# Patient Record
Sex: Female | Born: 1975 | Hispanic: Yes | Marital: Married | Smoking: Never smoker
Health system: Southern US, Community
[De-identification: ages and names within clinical notes are randomized; demographics above are authoritative.]

## PROBLEM LIST (undated history)

## (undated) DIAGNOSIS — E119 Type 2 diabetes mellitus without complications: Secondary | ICD-10-CM

## (undated) HISTORY — PX: CHOLECYSTECTOMY: SHX55

---

## 2005-09-15 ENCOUNTER — Ambulatory Visit: Payer: Self-pay

## 2010-08-11 ENCOUNTER — Ambulatory Visit: Payer: Self-pay | Admitting: Obstetrics and Gynecology

## 2010-10-20 ENCOUNTER — Ambulatory Visit: Payer: Self-pay | Admitting: Obstetrics and Gynecology

## 2016-09-15 ENCOUNTER — Other Ambulatory Visit
Admission: RE | Admit: 2016-09-15 | Discharge: 2016-09-15 | Disposition: A | Discharge status: Home / Self Care | Payer: BLUE CROSS/BLUE SHIELD | Source: Ambulatory Visit | Attending: Obstetrics and Gynecology | Admitting: Obstetrics and Gynecology

## 2016-09-15 ENCOUNTER — Other Ambulatory Visit: Payer: Self-pay | Admitting: Obstetrics and Gynecology

## 2016-09-15 DIAGNOSIS — Z01419 Encounter for gynecological examination (general) (routine) without abnormal findings: Secondary | ICD-10-CM | POA: Insufficient documentation

## 2016-09-15 DIAGNOSIS — Z1231 Encounter for screening mammogram for malignant neoplasm of breast: Secondary | ICD-10-CM

## 2016-09-15 LAB — CBC WITH DIFFERENTIAL/PLATELET
BASOS PCT: 0 %
Basophils Absolute: 0 10*3/uL (ref 0–0.1)
EOS ABS: 0.2 10*3/uL (ref 0–0.7)
EOS PCT: 3 %
HCT: 39.1 % (ref 35.0–47.0)
HEMOGLOBIN: 12.9 g/dL (ref 12.0–16.0)
Lymphocytes Relative: 25 %
Lymphs Abs: 2.4 10*3/uL (ref 1.0–3.6)
MCH: 24.9 pg — ABNORMAL LOW (ref 26.0–34.0)
MCHC: 33 g/dL (ref 32.0–36.0)
MCV: 75.5 fL — ABNORMAL LOW (ref 80.0–100.0)
Monocytes Absolute: 0.6 10*3/uL (ref 0.2–0.9)
Monocytes Relative: 6 %
NEUTROS PCT: 66 %
Neutro Abs: 6.3 10*3/uL (ref 1.4–6.5)
PLATELETS: 243 10*3/uL (ref 150–440)
RBC: 5.18 MIL/uL (ref 3.80–5.20)
RDW: 16.2 % — ABNORMAL HIGH (ref 11.5–14.5)
WBC: 9.5 10*3/uL (ref 3.6–11.0)

## 2016-09-15 LAB — COMPREHENSIVE METABOLIC PANEL
ALBUMIN: 4.3 g/dL (ref 3.5–5.0)
ALK PHOS: 63 U/L (ref 38–126)
ALT: 19 U/L (ref 14–54)
ANION GAP: 7 (ref 5–15)
AST: 18 U/L (ref 15–41)
BUN: 16 mg/dL (ref 6–20)
CALCIUM: 9.2 mg/dL (ref 8.9–10.3)
CHLORIDE: 106 mmol/L (ref 101–111)
CO2: 25 mmol/L (ref 22–32)
CREATININE: 0.68 mg/dL (ref 0.44–1.00)
GFR calc Af Amer: >60 mL/min (ref >60)
GFR calc non Af Amer: >60 mL/min (ref >60)
GLUCOSE: 99 mg/dL (ref 65–99)
Potassium: 4.1 mmol/L (ref 3.5–5.1)
SODIUM: 138 mmol/L (ref 135–145)
Total Bilirubin: 0.5 mg/dL (ref 0.3–1.2)
Total Protein: 7.4 g/dL (ref 6.5–8.1)

## 2016-09-15 LAB — LIPID PANEL
CHOLESTEROL: 151 mg/dL (ref 0–200)
HDL: 40 mg/dL — ABNORMAL LOW (ref >40)
LDL CALC: 95 mg/dL (ref 0–99)
Total CHOL/HDL Ratio: 3.8 RATIO
Triglycerides: 81 mg/dL (ref <150)
VLDL: 16 mg/dL (ref 0–40)

## 2016-09-15 LAB — TSH: TSH: 2.119 u[IU]/mL (ref 0.350–4.500)

## 2016-09-16 LAB — HEMOGLOBIN A1C
Hgb A1c MFr Bld: 6.6 % — ABNORMAL HIGH (ref 4.8–5.6)
Mean Plasma Glucose: 143 mg/dL

## 2016-09-26 ENCOUNTER — Encounter: Payer: Self-pay | Admitting: Radiology

## 2016-09-26 ENCOUNTER — Ambulatory Visit
Admission: RE | Admit: 2016-09-26 | Discharge: 2016-09-26 | Disposition: A | Discharge status: Home / Self Care | Payer: BLUE CROSS/BLUE SHIELD | Source: Ambulatory Visit | Attending: Obstetrics and Gynecology | Admitting: Obstetrics and Gynecology

## 2016-09-26 DIAGNOSIS — Z1231 Encounter for screening mammogram for malignant neoplasm of breast: Secondary | ICD-10-CM | POA: Insufficient documentation

## 2021-10-08 ENCOUNTER — Emergency Department (HOSPITAL_BASED_OUTPATIENT_CLINIC_OR_DEPARTMENT_OTHER): Payer: 59

## 2021-10-08 ENCOUNTER — Encounter (HOSPITAL_BASED_OUTPATIENT_CLINIC_OR_DEPARTMENT_OTHER): Payer: Self-pay | Admitting: Emergency Medicine

## 2021-10-08 ENCOUNTER — Emergency Department (HOSPITAL_BASED_OUTPATIENT_CLINIC_OR_DEPARTMENT_OTHER)
Admission: EM | Admit: 2021-10-08 | Discharge: 2021-10-08 | Disposition: A | Discharge status: Home / Self Care | Payer: 59 | Attending: Emergency Medicine | Admitting: Emergency Medicine

## 2021-10-08 ENCOUNTER — Other Ambulatory Visit: Payer: Self-pay

## 2021-10-08 DIAGNOSIS — R55 Syncope and collapse: Secondary | ICD-10-CM | POA: Diagnosis not present

## 2021-10-08 DIAGNOSIS — R519 Headache, unspecified: Secondary | ICD-10-CM

## 2021-10-08 DIAGNOSIS — J0110 Acute frontal sinusitis, unspecified: Secondary | ICD-10-CM | POA: Diagnosis not present

## 2021-10-08 DIAGNOSIS — E119 Type 2 diabetes mellitus without complications: Secondary | ICD-10-CM | POA: Diagnosis not present

## 2021-10-08 HISTORY — DX: Type 2 diabetes mellitus without complications: E11.9

## 2021-10-08 LAB — HCG, SERUM, QUALITATIVE: Preg, Serum: NEGATIVE

## 2021-10-08 LAB — CBC
HCT: 39.3 % (ref 36.0–46.0)
Hemoglobin: 12.3 g/dL (ref 12.0–15.0)
MCH: 23.7 pg — ABNORMAL LOW (ref 26.0–34.0)
MCHC: 31.3 g/dL (ref 30.0–36.0)
MCV: 75.7 fL — ABNORMAL LOW (ref 80.0–100.0)
Platelets: 356 10*3/uL (ref 150–400)
RBC: 5.19 MIL/uL — ABNORMAL HIGH (ref 3.87–5.11)
RDW: 14.9 % (ref 11.5–15.5)
WBC: 8.2 10*3/uL (ref 4.0–10.5)
nRBC: 0 % (ref 0.0–0.2)

## 2021-10-08 LAB — BASIC METABOLIC PANEL
Anion gap: 8 (ref 5–15)
BUN: 12 mg/dL (ref 6–20)
CO2: 26 mmol/L (ref 22–32)
Calcium: 9.4 mg/dL (ref 8.9–10.3)
Chloride: 101 mmol/L (ref 98–111)
Creatinine, Ser: 0.65 mg/dL (ref 0.44–1.00)
GFR, Estimated: >60 mL/min (ref >60)
Glucose, Bld: 194 mg/dL — ABNORMAL HIGH (ref 70–99)
Potassium: 3.5 mmol/L (ref 3.5–5.1)
Sodium: 135 mmol/L (ref 135–145)

## 2021-10-08 LAB — CBG MONITORING, ED: Glucose-Capillary: 212 mg/dL — ABNORMAL HIGH (ref 70–99)

## 2021-10-08 MED ORDER — AMOXICILLIN-POT CLAVULANATE 875-125 MG PO TABS: 1.0000 | ORAL_TABLET | Freq: Two times a day (BID) | ORAL | 0 refills | Status: AC

## 2021-10-08 NOTE — Discharge Instructions (Signed)
Take tylenol 2 pills 4 times a day and motrin 4 pills 3 times a day.  Drink plenty of fluids.  Return for worsening shortness of breath, headache, confusion. Follow up with your family doctor.   The picture of your head and the blood test did not show any obvious abnormality.  I am going to treat you for possible sinus infection.  Please follow-up with your doctor in the office.  With progressive worsening headaches have also referred you to see a neurologist in the office.

## 2021-10-08 NOTE — ED Provider Notes (Signed)
[facility] EMERGENCY DEPT Provider Note   CSN: [id number] Arrival date & time: 10/08/21  1238     History Chief Complaint  Patient presents with   Near Syncope    Heather Lowery is a 45 y.o. female.  45 yo F with chief complaints of progressive headaches sensation like she was going to pass out earlier today tremors.  These have been going on for about a week or so.  She has started a new position at work where she is exposed to some nonpoisonous gases that tend to give her worsening headaches.  They state that they are multiple members at work but also have similar symptoms.  She had an episode today where she felt worse and almost passed out.  At the time she had a significant headache but it is improved rapidly.  She denies one-sided numbness or weakness difficulty speech or swallowing.  Denies head trauma.  She denies cough or fever but has had some congestion and feels like she has trouble breathing through her nose.  She mentioned multiple times that she felt like she was having trouble breathing but really through her nose was the issue.  The history is provided by the patient.  Near Syncope This is a new problem. The current episode started less than 1 hour ago. The problem occurs constantly. The problem has not changed since onset.Associated symptoms include headaches. Pertinent negatives include no chest pain and no shortness of breath. Nothing aggravates the symptoms. Nothing relieves the symptoms. She has tried nothing for the symptoms. The treatment provided no relief.      Past Medical History:  Diagnosis Date   Diabetes mellitus without complication (HCC)     There are no problems to display for this patient.   Past Surgical History:  Procedure Laterality Date   CHOLECYSTECTOMY       OB History   No obstetric history on file.     Family History  Problem Relation Age of Onset   Breast cancer Neg Hx     Social History   Tobacco  Use   Smoking status: Never   Smokeless tobacco: Never  Substance Use Topics   Alcohol use: Not Currently   Drug use: Never    Home Medications Prior to Admission medications   Medication Sig Start Date End Date Taking? Authorizing Provider  amoxicillin-clavulanate (AUGMENTIN) 875-125 MG tablet Take 1 tablet by mouth every 12 (twelve) hours. 10/08/21  Yes Melene Plan, DO    Allergies    Patient has no allergy information on record.  Review of Systems   Review of Systems  Constitutional:  Negative for chills and fever.  HENT:  Negative for congestion and rhinorrhea.   Eyes:  Negative for redness and visual disturbance.  Respiratory:  Negative for shortness of breath and wheezing.   Cardiovascular:  Positive for near-syncope. Negative for chest pain and palpitations.  Gastrointestinal:  Negative for nausea and vomiting.  Genitourinary:  Negative for dysuria and urgency.  Musculoskeletal:  Negative for arthralgias and myalgias.  Skin:  Negative for pallor and wound.  Neurological:  Positive for syncope and headaches. Negative for dizziness.   Physical Exam Updated Vital Signs BP 134/73 (BP Location: Right Arm)   Pulse 71   Temp 99.1 F (37.3 C) (Oral)   Resp 20   Ht 5\' 2"  (1.575 m)   Wt 79.4 kg   LMP 10/01/2021   SpO2 100%   BMI 32.01 kg/m   Physical Exam Vitals and nursing  note reviewed.  Constitutional:      General: She is not in acute distress.    Appearance: She is well-developed. She is not diaphoretic.  HENT:     Head: Normocephalic and atraumatic.     Comments: Swollen turbinates, posterior nasal drip, left frontal sinus is tender to percussion, tm normal bilaterally.   Eyes:     Pupils: Pupils are equal, round, and reactive to light.  Cardiovascular:     Rate and Rhythm: Normal rate and regular rhythm.     Heart sounds: No murmur heard.   No friction rub. No gallop.  Pulmonary:     Effort: Pulmonary effort is normal.     Breath sounds: No wheezing or  rales.  Abdominal:     General: There is no distension.     Palpations: Abdomen is soft.     Tenderness: There is no abdominal tenderness.  Musculoskeletal:        General: No tenderness.     Cervical back: Normal range of motion and neck supple.  Skin:    General: Skin is warm and dry.  Neurological:     Mental Status: She is alert and oriented to person, place, and time.     Cranial Nerves: Cranial nerves 2-12 are intact.     Sensory: Sensation is intact.     Motor: Motor function is intact.     Coordination: Coordination is intact.     Gait: Gait is intact.  Psychiatric:        Behavior: Behavior normal.    ED Results / Procedures / Treatments   Labs (all labs ordered are listed, but only abnormal results are displayed) Labs Reviewed  BASIC METABOLIC PANEL - Abnormal; Notable for the following components:      Result Value   Glucose, Bld 194 (*)    All other components within normal limits  CBC - Abnormal; Notable for the following components:   RBC 5.19 (*)    MCV 75.7 (*)    MCH 23.7 (*)    All other components within normal limits  CBG MONITORING, ED - Abnormal; Notable for the following components:   Glucose-Capillary 212 (*)    All other components within normal limits  HCG, SERUM, QUALITATIVE  URINALYSIS, ROUTINE W REFLEX MICROSCOPIC    EKG EKG Interpretation  Date/Time:  Friday October 08 2021 13:00:50 EST Ventricular Rate:  69 PR Interval:  140 QRS Duration: 98 QT Interval:  388 QTC Calculation: 415 R Axis:   75 Text Interpretation: Normal sinus rhythm Incomplete right bundle branch block Borderline ECG No significant change since last tracing Confirmed by Deno Etienne [phone]) on 10/08/2021 1:07:56 PM  Radiology CT Head Wo Contrast  Result Date: 10/08/2021 CLINICAL DATA:  Headache, new or worsening EXAM: CT HEAD WITHOUT CONTRAST TECHNIQUE: Contiguous axial images were obtained from the base of the skull through the vertex without intravenous contrast.  COMPARISON:  09/15/2005 FINDINGS: Brain: No evidence of acute infarction, hemorrhage, hydrocephalus, extra-axial collection or mass lesion/mass effect. Vascular: No hyperdense vessel or unexpected calcification. Skull: Normal. Negative for fracture or focal lesion. Sinuses/Orbits: No acute finding. Other: None. IMPRESSION: No acute intracranial findings. Electronically Signed   By: Davina Poke D.O.   On: 10/08/2021 13:50    Procedures Procedures   Medications Ordered in ED Medications - No data to display  ED Course  I have reviewed the triage vital signs and the nursing notes.  Pertinent labs & imaging results that were available during my care  of the patient were reviewed by me and considered in my medical decision making (see chart for details).    MDM Rules/Calculators/A&P                           45 yo F with a chief complaints of headaches and a sensation today like she might pass out.  The family thinks that this may be due to chemical exposure at work.  She has been having these more often whenever she is at work.  On exam the patient has some signs that she has a viral syndrome she also has left frontal sinus tenderness to percussion.  I will treat her for sinusitis.  CT of the head blood work.  CT of the head negative.  No anemia no acute electrolyte abnormality.  We will discharge the patient home.  Have her follow-up with her PCP.  Given referral to neurology.  2:06 PM:  I have discussed the diagnosis/risks/treatment options with the patient and family and believe the pt to be eligible for discharge home to follow-up with PCP, neuro. We also discussed returning to the ED immediately if new or worsening sx occur. We discussed the sx which are most concerning (e.g., sudden worsening pain, fever, inability to tolerate by mouth) that necessitate immediate return. Medications administered to the patient during their visit and any new prescriptions provided to the patient are listed  below.  Medications given during this visit Medications - No data to display   The patient appears reasonably screen and/or stabilized for discharge and I doubt any other medical condition or other [facility] requiring further screening, evaluation, or treatment in the ED at this time prior to discharge.   Final Clinical Impression(s) / ED Diagnoses Final diagnoses:  Near syncope  Frontal headache  Acute frontal sinusitis, recurrence not specified    Rx / DC Orders ED Discharge Orders          Ordered    amoxicillin-clavulanate (AUGMENTIN) 875-125 MG tablet  Every 12 hours        10/08/21 1404    Ambulatory referral to Neurology        10/08/21 [place], [place], DO 10/08/21 1406

## 2021-10-08 NOTE — ED Notes (Signed)
RN provided AVS using Teachback Method. Patient verbalizes understanding of Discharge Instructions. Opportunity for Questioning and Answers were provided by RN. Patient Discharged from ED ambulatory to Home with Family. ? ?

## 2021-10-08 NOTE — ED Triage Notes (Signed)
Pt arrives pov with c/o near syncope while at work. Pt endorses feeling weak, possible exposure to a type of non poisonous gas. 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

## 2021-10-08 NOTE — ED Notes (Signed)
Patient transferred to and from CT. 

## 2021-11-24 ENCOUNTER — Ambulatory Visit: Payer: Self-pay

## 2023-05-19 IMAGING — CT CT HEAD W/O CM
4 series · 16 of 47 positions shown, 18 images · non-contrast
Comparison: 09/15/2005

CLINICAL DATA: Headache, new or worsening

EXAM:
CT HEAD WITHOUT CONTRAST
TECHNIQUE: Contiguous axial images were obtained from the base of the skull
through the vertex without intravenous contrast.

[Series 2: head wo · axial · 0.43mm/px · z∈[+1282,+1392]mm · 7 of 30 slices shown, 9 images]
[im 4/30  brain]
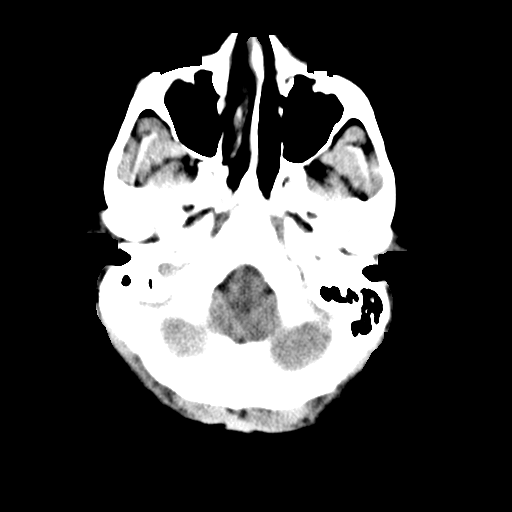
[im 4/30  bone]
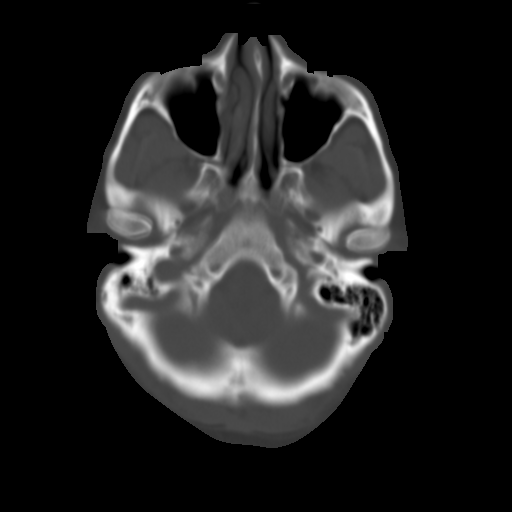
[im 8/30  brain]
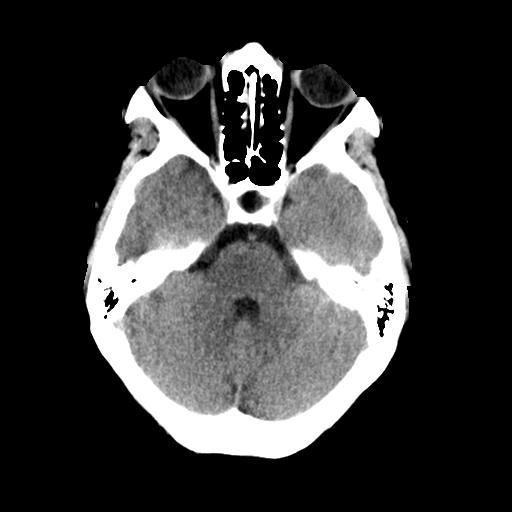
[im 11/30  brain]
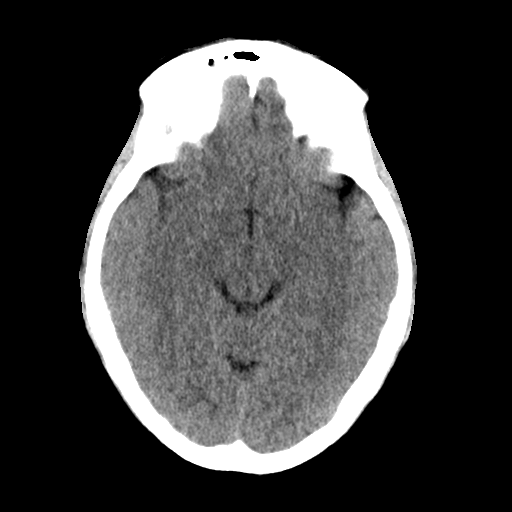
[im 15/30  brain]
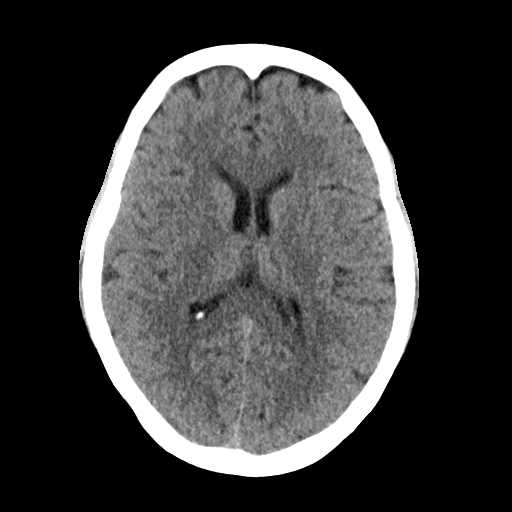
[im 19/30  brain]
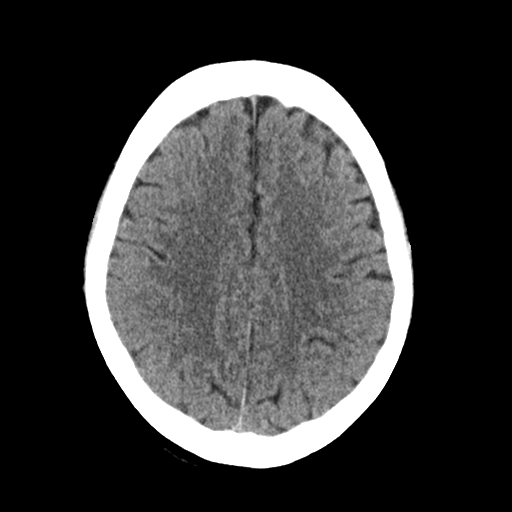
[im 19/30  bone]
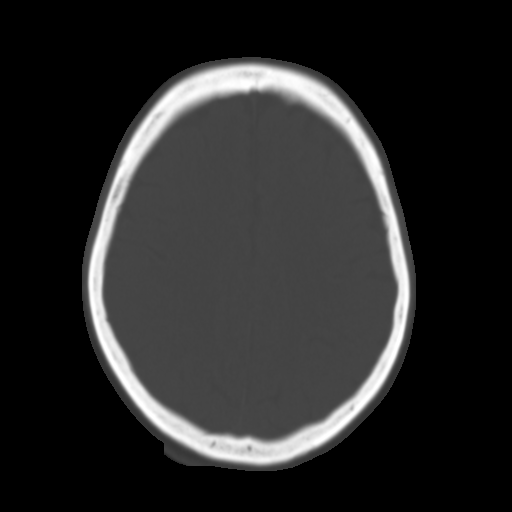
[im 22/30  brain]
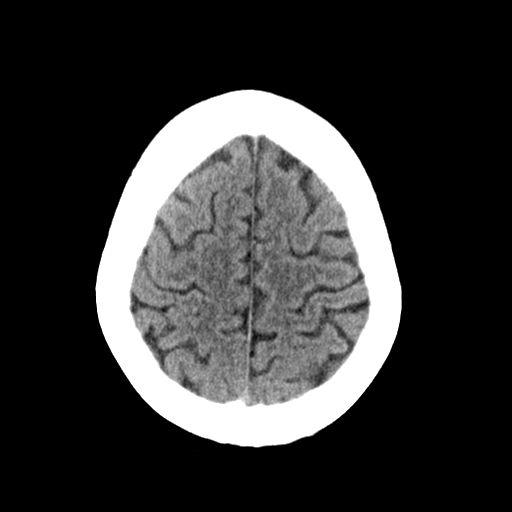
[im 26/30  brain]
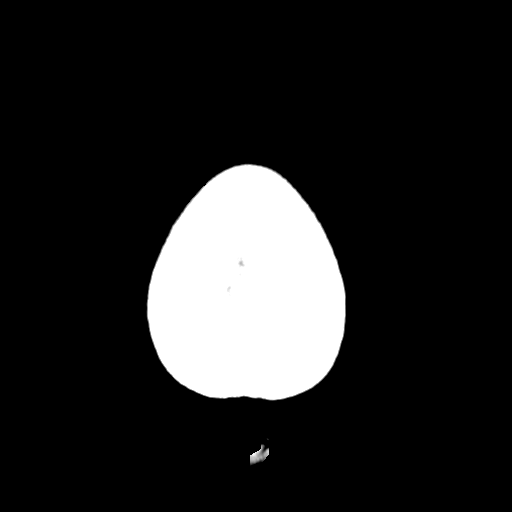

[Series 3: head bone · axial · 0.43mm/px · z∈[+1281,+1311]mm · 3 of 75 slices shown]
[im 8/75  bone]
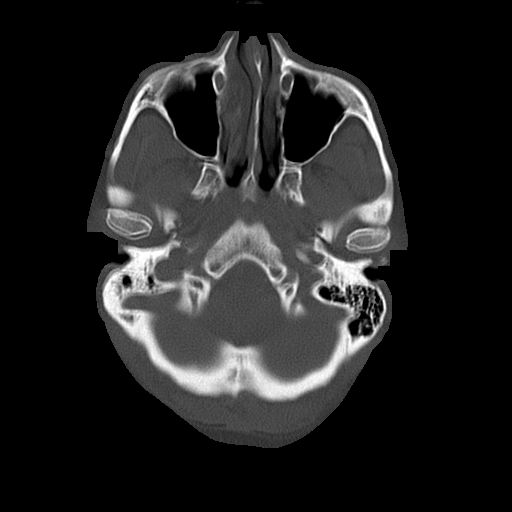
[im 15/75  bone]
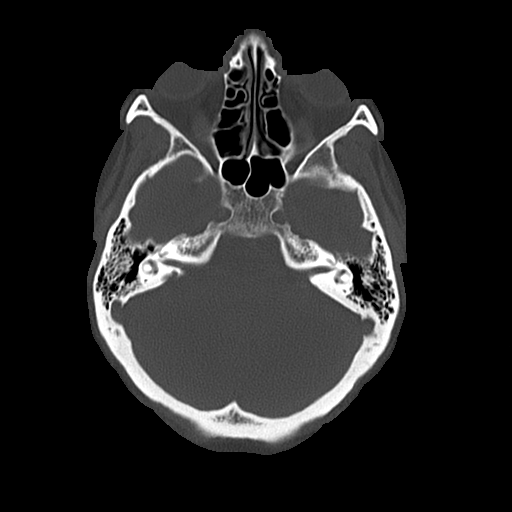
[im 23/75  bone]
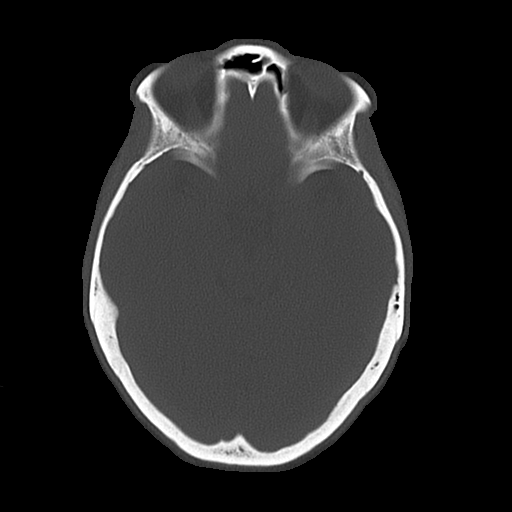

[Series 4: coronal soft · coronal · 0.29mm/px · 3 of 72 slices shown]
[im 24/72  brain]
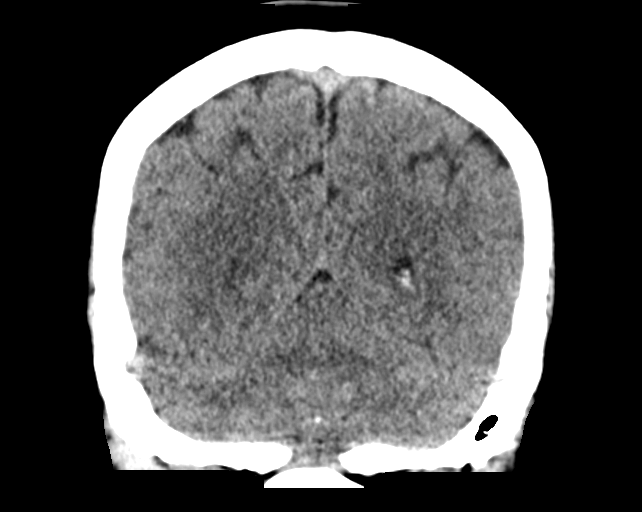
[im 32/72  brain]
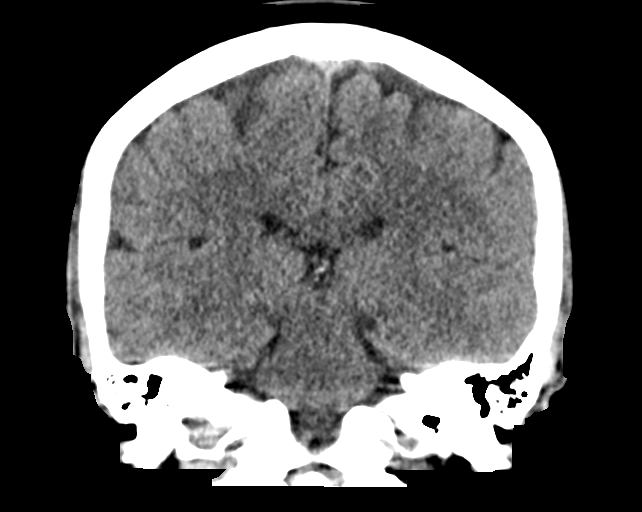
[im 40/72  brain]
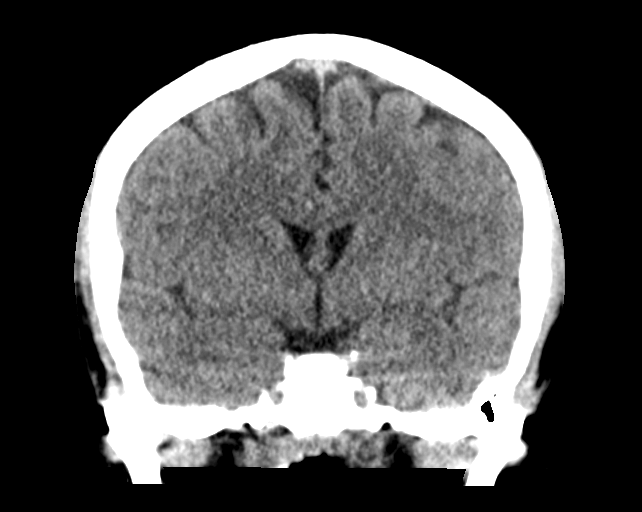

[Series 5: sagittal soft · sagittal · 0.29mm/px · 3 of 57 slices shown]
[im 19/57  brain]
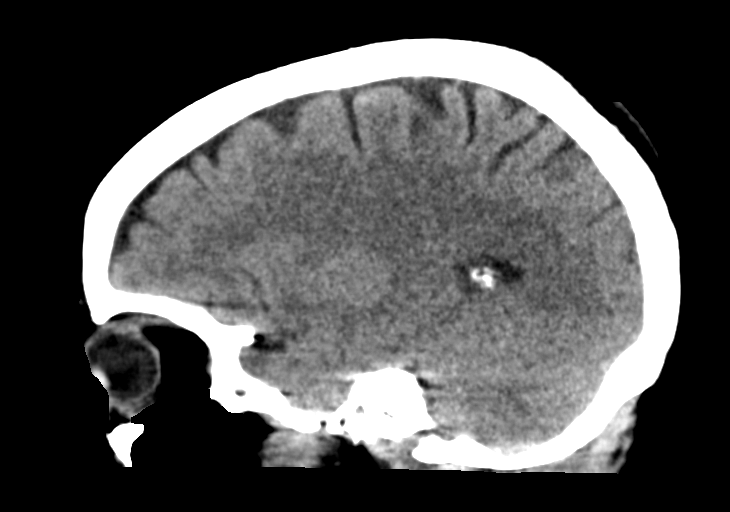
[im 29/57  brain]
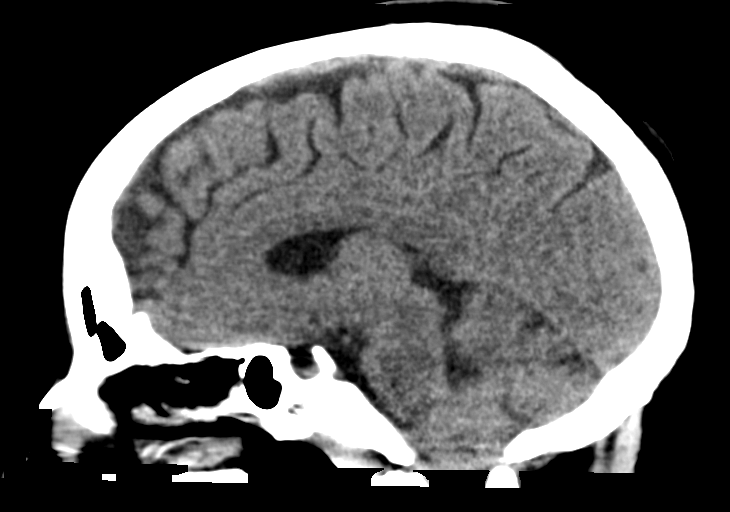
[im 38/57  brain]
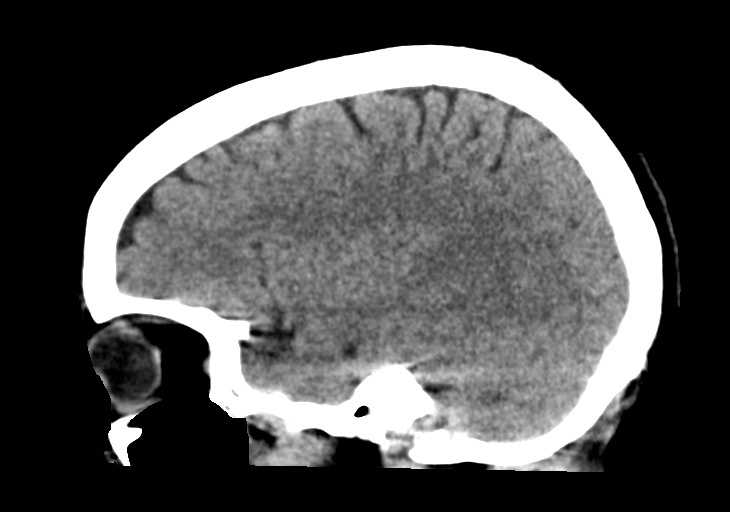

[16 of 47 positions shown; findings below may reference images not displayed]

FINDINGS: Brain: No evidence of acute infarction, hemorrhage, hydrocephalus,
extra-axial collection or mass lesion/mass effect.

Vascular: No hyperdense vessel or unexpected calcification.

Skull: Normal. Negative for fracture or focal lesion.

Sinuses/Orbits: No acute finding.

Other: None.
IMPRESSION: No acute intracranial findings.

## 2024-12-19 ENCOUNTER — Ambulatory Visit: Payer: Self-pay
